# Patient Record
Sex: Male | Born: 2000 | Race: Black or African American | Hispanic: No | Marital: Single | Smoking: Never smoker
Health system: Southern US, Community
[De-identification: ages and names within clinical notes are randomized; demographics above are authoritative.]

## PROBLEM LIST (undated history)

## (undated) DIAGNOSIS — R45851 Suicidal ideations: Secondary | ICD-10-CM

---
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
## 2019-08-13 IMAGING — DX DG HAND COMPLETE 3+V*L*
3 series · 3 of 3 positions shown · non-contrast
Comparison: None.

CLINICAL DATA: Fourth and fifth metacarpal pain post fall.

EXAM:
LEFT HAND - COMPLETE 3+ VIEW

[hand pa]
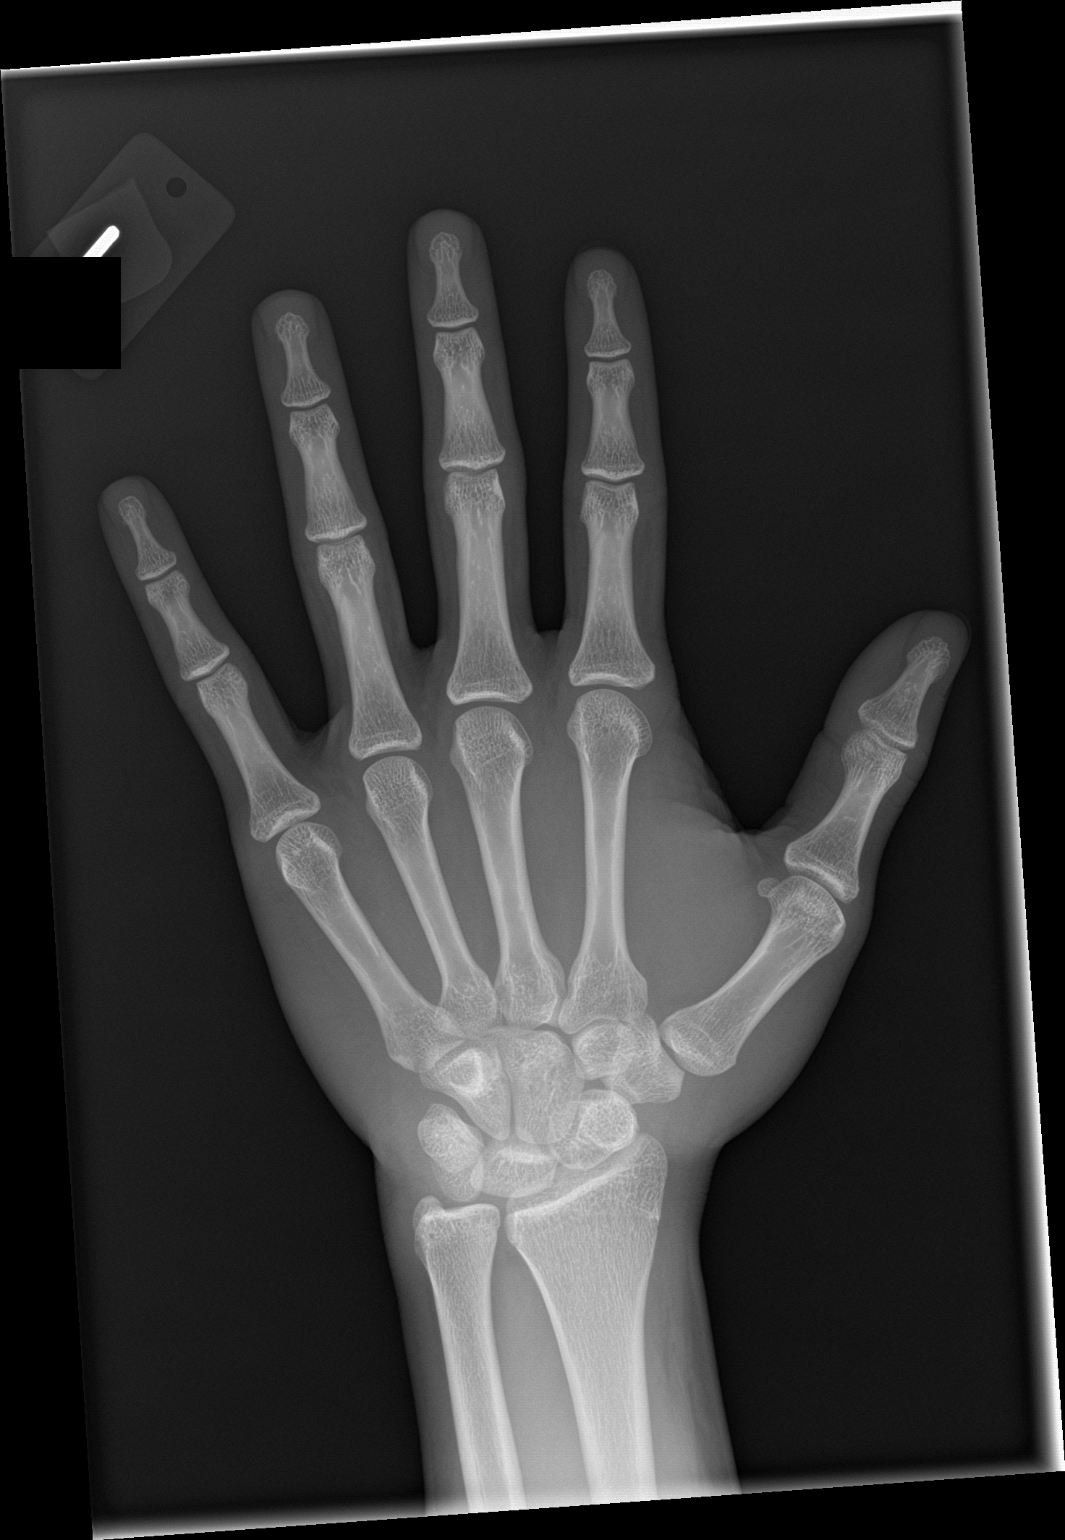

[hand obl]
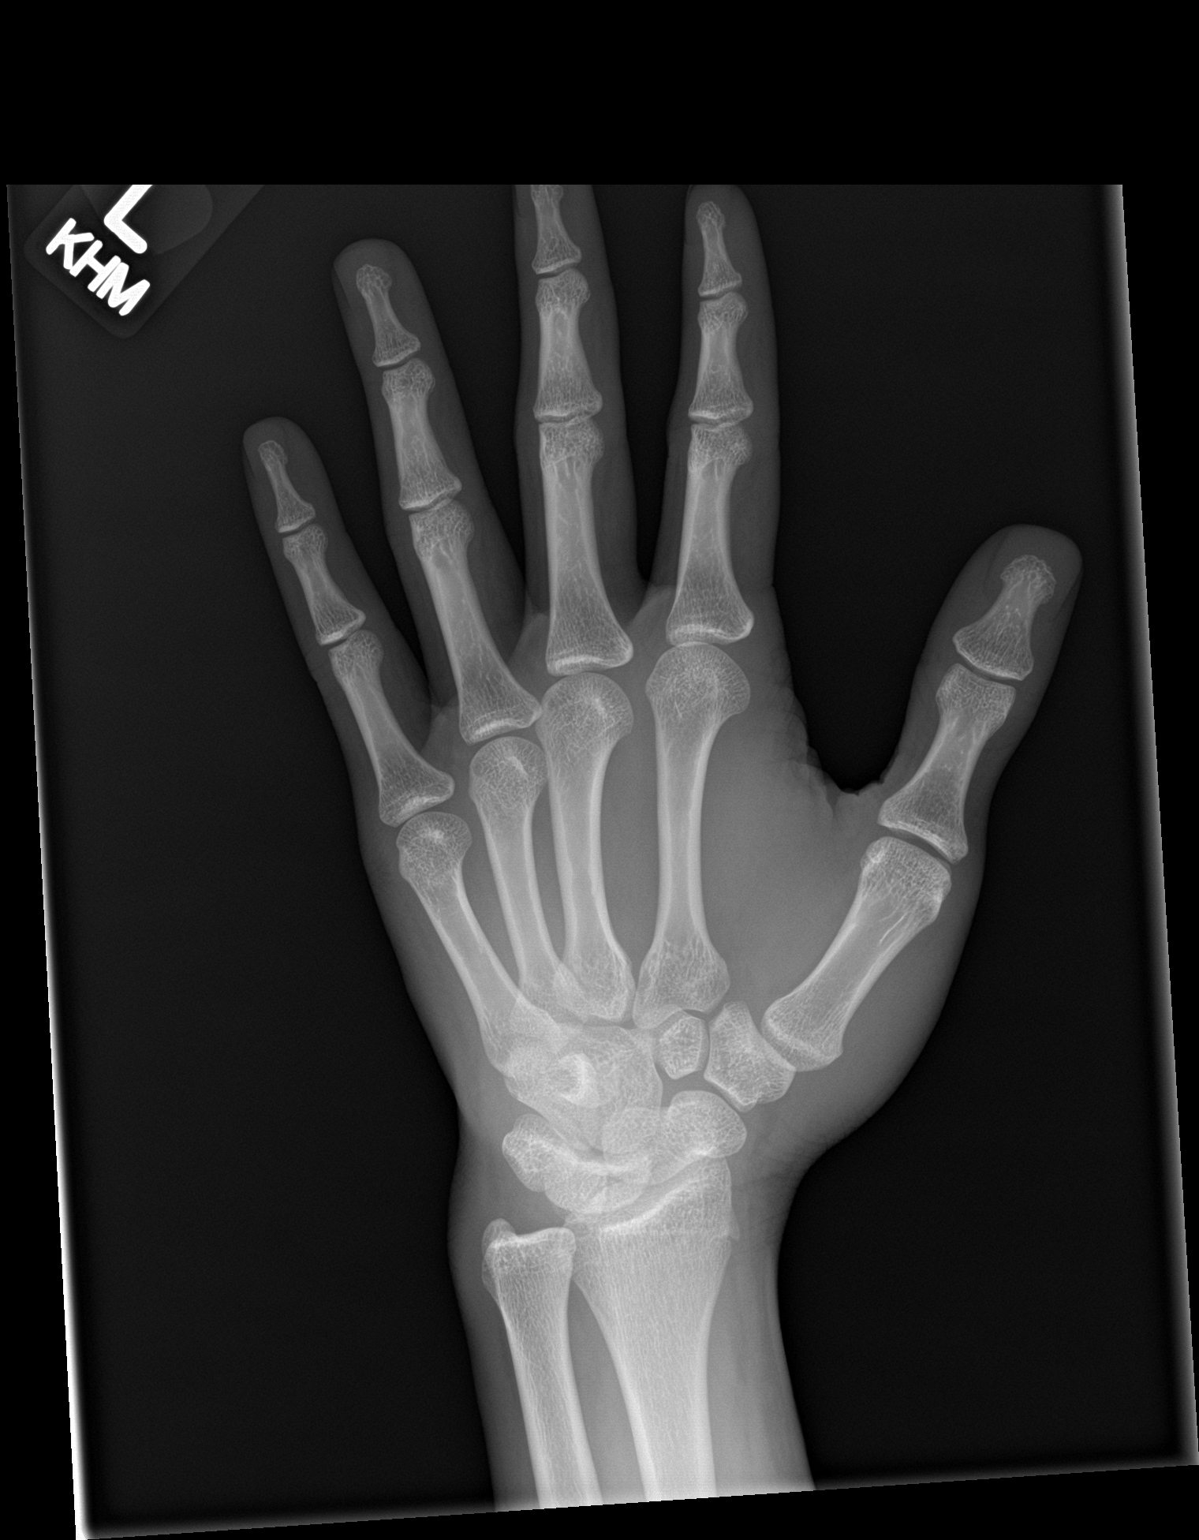

[hand lat]
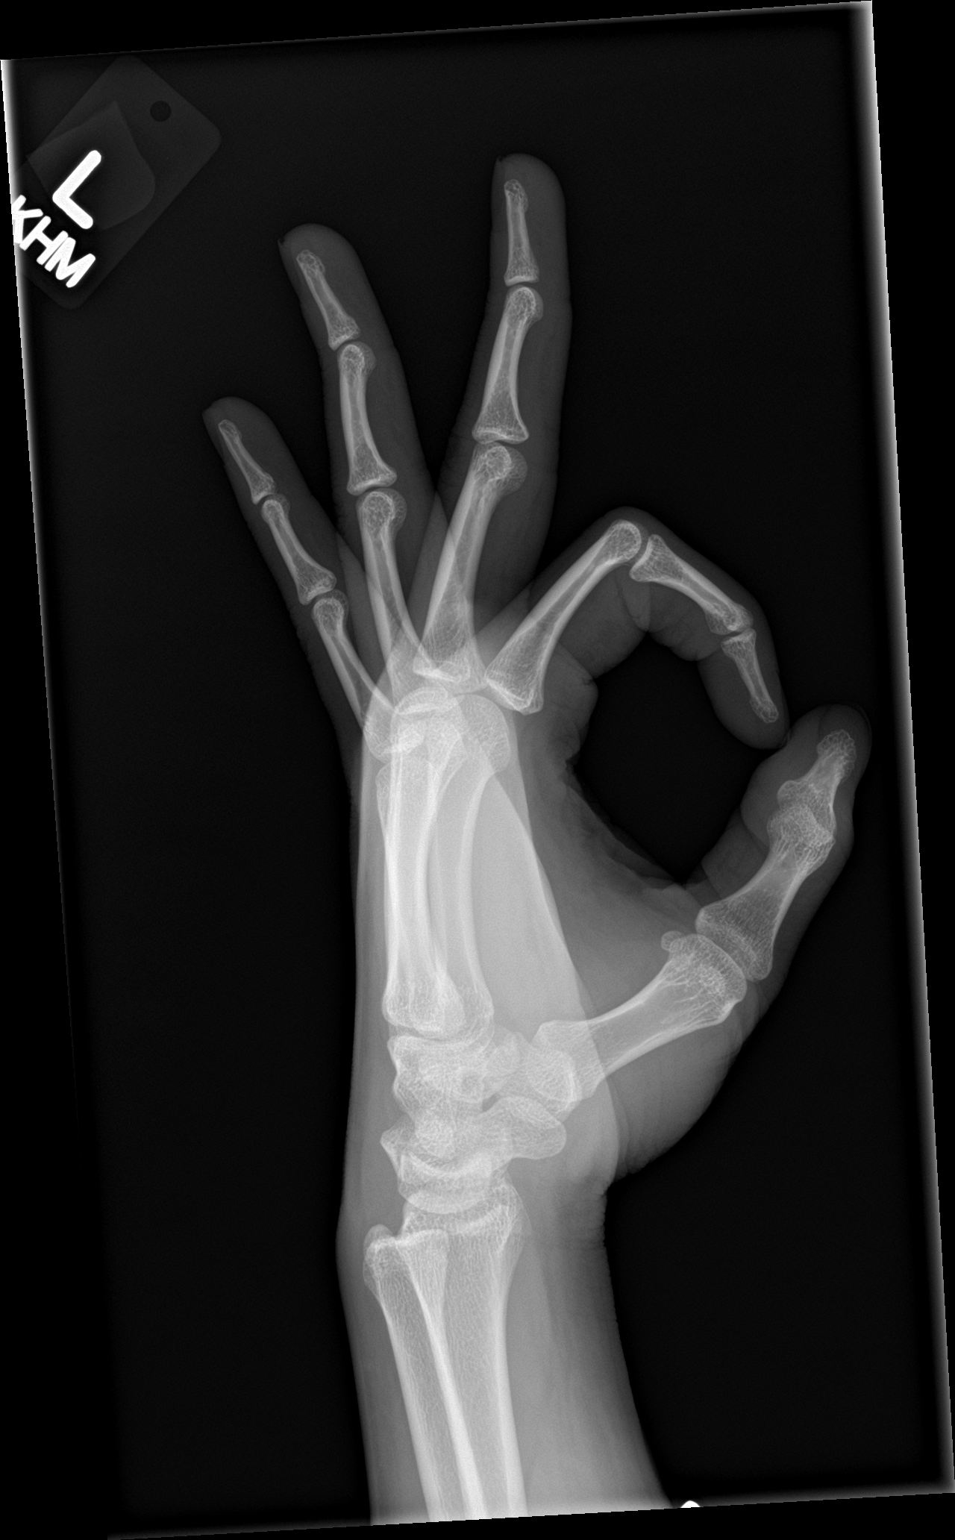

[3 of 3 positions shown; findings below may reference images not displayed]

FINDINGS: There is no evidence of fracture or dislocation. There is no
evidence of arthropathy or other focal bone abnormality. Soft
tissues are unremarkable.
IMPRESSION: Negative.

## 2020-03-03 DIAGNOSIS — Z20822 Contact with and (suspected) exposure to covid-19: Secondary | ICD-10-CM | POA: Diagnosis not present

## 2021-01-29 ENCOUNTER — Other Ambulatory Visit: Payer: Self-pay

## 2021-01-29 ENCOUNTER — Encounter (HOSPITAL_COMMUNITY): Payer: Self-pay

## 2021-01-29 ENCOUNTER — Emergency Department (HOSPITAL_COMMUNITY)
Admission: EM | Admit: 2021-01-29 | Discharge: 2021-01-29 | Disposition: A | Discharge status: Home / Self Care | Payer: Medicaid Other | Attending: Emergency Medicine | Admitting: Emergency Medicine

## 2021-01-29 DIAGNOSIS — J029 Acute pharyngitis, unspecified: Secondary | ICD-10-CM

## 2021-01-29 DIAGNOSIS — B9789 Other viral agents as the cause of diseases classified elsewhere: Secondary | ICD-10-CM | POA: Diagnosis not present

## 2021-01-29 DIAGNOSIS — Z20822 Contact with and (suspected) exposure to covid-19: Secondary | ICD-10-CM | POA: Diagnosis not present

## 2021-01-29 DIAGNOSIS — J069 Acute upper respiratory infection, unspecified: Secondary | ICD-10-CM | POA: Insufficient documentation

## 2021-01-29 LAB — GROUP A STREP BY PCR: Group A Strep by PCR: NOT DETECTED

## 2021-01-29 LAB — RESP PANEL BY RT-PCR (FLU A&B, COVID) ARPGX2
Influenza A by PCR: NEGATIVE
Influenza B by PCR: NEGATIVE
SARS Coronavirus 2 by RT PCR: NEGATIVE

## 2021-01-29 MED ORDER — FLUTICASONE PROPIONATE 50 MCG/ACT NA SUSP: 1.0000 | Freq: Every day | NASAL | 2 refills | Status: AC

## 2021-01-29 MED ORDER — IBUPROFEN 800 MG PO TABS: 800.0000 mg | ORAL_TABLET | Freq: Three times a day (TID) | ORAL | 0 refills | Status: AC

## 2021-01-29 MED ORDER — LIDOCAINE VISCOUS HCL 2 % MT SOLN
15.0000 mL | Freq: Once | OROMUCOSAL | Status: AC
Start: 2021-01-29 — End: 2021-01-29
  Administered 2021-01-29: 15 mL via OROMUCOSAL
  Filled 2021-01-29: qty 15

## 2021-01-29 MED ORDER — IBUPROFEN 800 MG PO TABS
800.0000 mg | ORAL_TABLET | Freq: Once | ORAL | Status: AC
  Administered 2021-01-29: 800 mg via ORAL
  Filled 2021-01-29: qty 1

## 2021-01-29 NOTE — ED Triage Notes (Signed)
Pt presents to ED with complaints of sore throat, headache, congestion, fever x 3-4 days. Pt states he took theraflu with a little relief.

## 2021-01-29 NOTE — ED Provider Notes (Signed)
[facility] EMERGENCY DEPARTMENT Provider Note   CSN: [id number] Arrival date & time: 01/29/21  0846     History Chief Complaint  Patient presents with   Sore Throat    Michael Mccarty is a 20 y.o. male.  Pt presents to the ED today with a sore throat, headache, and cough.  He has been sick for 3-4 days.  He has tried theraflu without improvement in sx.  He took a home Covid test which was negative 3 days ago.  Pt has not been vaccinated for Covid or for the flu.      Past Medical History:  Diagnosis Date   Suicidal intent     There are no problems to display for this patient.   History reviewed. No pertinent surgical history.     No family history on file.  Social History   Tobacco Use   Smoking status: Never   Smokeless tobacco: Never  Vaping Use   Vaping Use: Never used  Substance Use Topics   Alcohol use: No   Drug use: No    Home Medications Prior to Admission medications   Medication Sig Start Date End Date Taking? Authorizing Provider  fluticasone (FLONASE) 50 MCG/ACT nasal spray Place 1 spray into both nostrils daily. 01/29/21  Yes Jacalyn Lefevre, MD  ibuprofen (ADVIL) 800 MG tablet Take 1 tablet (800 mg total) by mouth 3 (three) times daily. 01/29/21  Yes Jacalyn Lefevre, MD    Allergies    Patient has no known allergies.  Review of Systems   Review of Systems  Constitutional:  Positive for fever.  HENT:  Positive for sore throat.   Respiratory:  Positive for cough.   All other systems reviewed and are negative.  Physical Exam Updated Vital Signs BP 129/87 (BP Location: Right Arm)   Pulse 72   Temp 98.1 F (36.7 C) (Oral)   Resp 18   Ht 5\' 6"  (1.676 m)   Wt 81.6 kg   SpO2 95%   BMI 29.05 kg/m   Physical Exam Vitals and nursing note reviewed.  Constitutional:      Appearance: He is well-developed.  HENT:     Head: Normocephalic and atraumatic.     Nose: Congestion present.     Mouth/Throat:     Pharynx: Oropharynx is  clear.  Eyes:     Conjunctiva/sclera: Conjunctivae normal.     Pupils: Pupils are equal, round, and reactive to light.  Cardiovascular:     Rate and Rhythm: Normal rate and regular rhythm.  Pulmonary:     Effort: Pulmonary effort is normal.     Breath sounds: Normal breath sounds.  Abdominal:     General: Bowel sounds are normal.     Palpations: Abdomen is soft.  Musculoskeletal:     Cervical back: Normal range of motion and neck supple.  Skin:    General: Skin is warm.     Capillary Refill: Capillary refill takes less than 2 seconds.  Neurological:     General: No focal deficit present.     Mental Status: He is alert and oriented to person, place, and time.  Psychiatric:        Mood and Affect: Mood normal.        Behavior: Behavior normal.    ED Results / Procedures / Treatments   Labs (all labs ordered are listed, but only abnormal results are displayed) Labs Reviewed  RESP PANEL BY RT-PCR (FLU A&B, COVID) ARPGX2  GROUP A STREP BY  PCR    EKG None  Radiology No results found.  Procedures Procedures   Medications Ordered in ED Medications  ibuprofen (ADVIL) tablet 800 mg (800 mg Oral Given 01/29/21 1022)  lidocaine (XYLOCAINE) 2 % viscous mouth solution 15 mL (15 mLs Mouth/Throat Given 01/29/21 1022)    ED Course  I have reviewed the triage vital signs and the nursing notes.  Pertinent labs & imaging results that were available during my care of the patient were reviewed by me and considered in my medical decision making (see chart for details).    MDM Rules/Calculators/A&P                           Covid/flu/strep neg.  Pt looks nontoxic.  He is stable for d/c.  Return if worse. Final Clinical Impression(s) / ED Diagnoses Final diagnoses:  Viral pharyngitis  Viral upper respiratory tract infection    Rx / DC Orders ED Discharge Orders          Ordered    ibuprofen (ADVIL) 800 MG tablet  3 times daily        01/29/21 1132    fluticasone  (FLONASE) 50 MCG/ACT nasal spray  Daily        01/29/21 1133             Jacalyn Lefevre, MD 01/29/21 1134

## 2021-02-01 ENCOUNTER — Emergency Department (HOSPITAL_COMMUNITY)
Admission: EM | Admit: 2021-02-01 | Discharge: 2021-02-01 | Disposition: A | Discharge status: Home / Self Care | Payer: Medicaid Other | Attending: Emergency Medicine | Admitting: Emergency Medicine

## 2021-02-01 ENCOUNTER — Telehealth: Payer: Self-pay

## 2021-02-01 ENCOUNTER — Other Ambulatory Visit: Payer: Self-pay

## 2021-02-01 DIAGNOSIS — H5711 Ocular pain, right eye: Secondary | ICD-10-CM | POA: Diagnosis present

## 2021-02-01 DIAGNOSIS — H1031 Unspecified acute conjunctivitis, right eye: Secondary | ICD-10-CM | POA: Insufficient documentation

## 2021-02-01 DIAGNOSIS — B309 Viral conjunctivitis, unspecified: Secondary | ICD-10-CM

## 2021-02-01 DIAGNOSIS — H5710 Ocular pain, unspecified eye: Secondary | ICD-10-CM | POA: Diagnosis not present

## 2021-02-01 NOTE — ED Triage Notes (Signed)
Pt states eye is swollen up, red, watery, itching. Pt believes it is pink eye. Began two days ago and has gotten worse.

## 2021-02-01 NOTE — ED Provider Notes (Signed)
[facility] Provider Note   CSN: [id number] Arrival date & time: 02/01/21  1041     History Chief Complaint  Patient presents with   Eye Pain    Michael Mccarty is a 20 y.o. male presents emergency department for evaluation of his right eye swelling with discharge and conjunctival redness for the past 2 days.  He endorses some pain with extraocular movements.  No purulent drainage.  Denies any fevers.  Patient reports he had URI symptoms a few weeks ago, but none now.  He denies any photophobia, diplopia, or other visual disturbances.  Denies the episodes previous to this.  No clear surgical history.  Denies any medications.  No known drug allergies.  Denies any smoking, EtOH, or drug use.   Eye Pain      Past Medical History:  Diagnosis Date   Suicidal intent     There are no problems to display for this patient.   No past surgical history on file.     No family history on file.  Social History   Tobacco Use   Smoking status: Never   Smokeless tobacco: Never  Vaping Use   Vaping Use: Never used  Substance Use Topics   Alcohol use: No   Drug use: No    Home Medications Prior to Admission medications   Medication Sig Start Date End Date Taking? Authorizing Provider  fluticasone (FLONASE) 50 MCG/ACT nasal spray Place 1 spray into both nostrils daily. 01/29/21   Isla Pence, MD  ibuprofen (ADVIL) 800 MG tablet Take 1 tablet (800 mg total) by mouth 3 (three) times daily. 01/29/21   Isla Pence, MD    Allergies    Patient has no known allergies.  Review of Systems   Review of Systems  Constitutional:  Negative for fever.  HENT:  Negative for congestion and rhinorrhea.   Eyes:  Positive for discharge, redness and itching. Negative for photophobia, pain and visual disturbance.  Respiratory:  Negative for cough.    Physical Exam Updated Vital Signs BP 120/83 (BP Location: Right Arm)   Pulse 67   Temp 98 F (36.7 C) (Oral)    Resp 18   Ht 5\' 6"  (1.676 m)   Wt 81.6 kg   SpO2 99%   BMI 29.05 kg/m   Physical Exam Vitals and nursing note reviewed.  Constitutional:      General: He is not in acute distress.    Appearance: Normal appearance. He is normal weight. He is not toxic-appearing.  HENT:     Head: Normocephalic and atraumatic.     Right Ear: Tympanic membrane, ear canal and external ear normal.     Left Ear: Tympanic membrane, ear canal and external ear normal.     Nose: Nose normal.     Mouth/Throat:     Mouth: Mucous membranes are moist.     Pharynx: No oropharyngeal exudate or posterior oropharyngeal erythema.  Eyes:     General: No scleral icterus.       Right eye: Discharge present.        Left eye: No discharge.     Extraocular Movements: Extraocular movements intact.     Conjunctiva/sclera:     Right eye: Right conjunctiva is injected.     Pupils: Pupils are equal, round, and reactive to light.     Comments: Moderately swollen upper and lower right eyelid.  Conjunctiva erythematous.  Clear drainage noted from the right eye.  PERRLA.  EOMI.  Patient  has mild pain when leaning laterally.  Nontender to palpation.  Patient has nontender frontal or maxillary sinus.  Pulmonary:     Effort: Pulmonary effort is normal. No respiratory distress.  Skin:    General: Skin is dry.     Findings: No rash.  Neurological:     General: No focal deficit present.     Mental Status: He is alert. Mental status is at baseline.  Psychiatric:        Mood and Affect: Mood normal.    ED Results / Procedures / Treatments   Labs (all labs ordered are listed, but only abnormal results are displayed) Labs Reviewed - No data to display  EKG None  Radiology No results found.  Procedures Procedures   Medications Ordered in ED Medications - No data to display  ED Course  I have reviewed the triage vital signs and the nursing notes.  Pertinent labs & imaging results that were available during my care of  the patient were reviewed by me and considered in my medical decision making (see chart for details).  20 year old male presents emergency department for evaluation of right eye swelling and discharge.  Low suspicion for periorbital cellulitis as the patient does not endorse any photophobia, visual disturbance, fevers, severe pain with eye movement, or tenderness to the area.  Physical exam correlates more with viral conjunctivitis.  Discussed good eye hygiene with the patient.  Recommended lubricating eyedrops.  Strict return precautions discussed.  Patient agrees to plan.  Patient is stable being discharged home in good condition.    MDM Rules/Calculators/A&P                          Final Clinical Impression(s) / ED Diagnoses Final diagnoses:  Acute viral conjunctivitis of right eye    Rx / DC Orders ED Discharge Orders     None        Achille Rich, PA-C 02/01/21 1417    Derwood Kaplan, MD 02/02/21 [phone]

## 2021-02-01 NOTE — Telephone Encounter (Signed)
Transition Care Management Follow-up Telephone Call Date of discharge and from where: 01/29/2021 from [facility] How have you been since you were released from the hospital? Pt stated that he is feeling better and did not have any questions or concerns.  Any questions or concerns? No  Items Reviewed: Did the pt receive and understand the discharge instructions provided? Yes  Medications obtained and verified? Yes  Other? No  Any new allergies since your discharge? No  Dietary orders reviewed? No Do you have support at home? Yes   Functional Questionnaire: (I = Independent and D = Dependent) ADLs: I  Bathing/Dressing- I  Meal Prep- I  Eating- I  Maintaining continence- I  Transferring/Ambulation- I  Managing Meds- I   Follow up appointments reviewed:  PCP Hospital f/u appt confirmed? No   Specialist Hospital f/u appt confirmed? No   Are transportation arrangements needed? No  If their condition worsens, is the pt aware to call PCP or go to the Emergency Dept.? Yes Was the patient provided with contact information for the PCP's office or ED? Yes Was to pt encouraged to call back with questions or concerns? Yes

## 2021-02-01 NOTE — Discharge Instructions (Addendum)
You were seen here today for evaluation of your right eye.  This is likely conjunctivitis.  You can continue to apply warm compresses to the area. Apply artifical tears which can be picked up OTC.  You can take Tylenol or ibuprofen as needed for pain.  If you have any worsening symptoms such as photophobia, visual changes, worsening swelling or pain, please return to the nearest emergency department for reevaluation.

## 2021-02-02 ENCOUNTER — Telehealth: Payer: Self-pay

## 2021-02-02 NOTE — Telephone Encounter (Signed)
Transition Care Management Follow-up Telephone Call Date of discharge and from where: 02/01/2021 from [facility] How have you been since you were released from the hospital? Pt stated that he is feeling better and did not have any questions or concerns at this time.  Any questions or concerns? No  Items Reviewed: Did the pt receive and understand the discharge instructions provided? Yes  Medications obtained and verified? Yes  Other? No  Any new allergies since your discharge? No  Dietary orders reviewed? No Do you have support at home? Yes   Functional Questionnaire: (I = Independent and D = Dependent) ADLs: I  Bathing/Dressing- I  Meal Prep- I  Eating- I  Maintaining continence- I  Transferring/Ambulation- I  Managing Meds- I   Follow up appointments reviewed:  PCP Hospital f/u appt confirmed? No   Specialist Hospital f/u appt confirmed? No   Are transportation arrangements needed? No  If their condition worsens, is the pt aware to call PCP or go to the Emergency Dept.? Yes Was the patient provided with contact information for the PCP's office or ED? Yes Was to pt encouraged to call back with questions or concerns? Yes

## 2022-03-14 ENCOUNTER — Ambulatory Visit
Admission: EM | Admit: 2022-03-14 | Discharge: 2022-03-14 | Disposition: A | Discharge status: Home / Self Care | Payer: Medicaid Other | Attending: Physician Assistant | Admitting: Physician Assistant

## 2022-03-14 DIAGNOSIS — R051 Acute cough: Secondary | ICD-10-CM | POA: Insufficient documentation

## 2022-03-14 DIAGNOSIS — Z1152 Encounter for screening for COVID-19: Secondary | ICD-10-CM | POA: Insufficient documentation

## 2022-03-14 DIAGNOSIS — J069 Acute upper respiratory infection, unspecified: Secondary | ICD-10-CM | POA: Insufficient documentation

## 2022-03-14 MED ORDER — PROMETHAZINE-DM 6.25-15 MG/5ML PO SYRP: 5.0000 mL | ORAL_SOLUTION | Freq: Four times a day (QID) | ORAL | 0 refills | Status: AC | PRN

## 2022-03-14 NOTE — ED Provider Notes (Signed)
[name]    CSN: [id number] Arrival date & time: 03/14/22  9562      History   Chief Complaint Chief Complaint  Patient presents with   Cough   Generalized Body Aches   Headache    HPI Michael Mccarty is a 22 y.o. male.   22 year old male presents with cough and congestion.  Patient indicates for the past 3 to 4 days he has been having increasing upper respiratory congestion with sinus congestion frontal and maxillary, intermittent headaches.  Patient also indicates he has been having rhinitis and postnasal drip which is clear.  He also indicates he been having chest congestion with intermittent cough with production being clear.  Patient denies any wheezing or shortness of breath.  He does indicate that he has had some chills over the past couple days but no fever.  Patient indicates that he believes that his mother had flu and his bowels had similar type symptoms however he has not had contact with either over the past week.  Patient indicates he has been taking some Tylenol without relief of the symptoms.  Patient has not taken any cold preparations to help control the cough and congestion.  Patient denies any nausea or vomiting.   Cough Associated symptoms: headaches and rhinorrhea   Headache Associated symptoms: cough, drainage and sinus pressure     Past Medical History:  Diagnosis Date   Suicidal intent     There are no problems to display for this patient.   History reviewed. No pertinent surgical history.     Home Medications    Prior to Admission medications   Medication Sig Start Date End Date Taking? Authorizing Provider  promethazine-dextromethorphan (PROMETHAZINE-DM) 6.25-15 MG/5ML syrup Take 5 mLs by mouth 4 (four) times daily as needed for cough. 03/14/22  Yes Nyoka Lint, PA-C  fluticasone [facility]) 50 MCG/ACT nasal spray Place 1 spray into both nostrils daily. 01/29/21   Isla Pence, MD  ibuprofen (ADVIL) 800 MG tablet Take 1  tablet (800 mg total) by mouth 3 (three) times daily. 01/29/21   Isla Pence, MD    Family History Family History  Family history unknown: Yes    Social History Social History   Tobacco Use   Smoking status: Never   Smokeless tobacco: Never  Vaping Use   Vaping Use: Never used  Substance Use Topics   Alcohol use: No   Drug use: No     Allergies   Patient has no known allergies.   Review of Systems Review of Systems  HENT:  Positive for postnasal drip, rhinorrhea and sinus pressure.   Respiratory:  Positive for cough.   Neurological:  Positive for headaches.     Physical Exam Triage Vital Signs ED Triage Vitals  Enc Vitals Group     BP 03/14/22 0954 123/84     Pulse Rate 03/14/22 0954 74     Resp 03/14/22 0954 18     Temp 03/14/22 0954 98.3 F (36.8 C)     Temp Source 03/14/22 0954 Oral     SpO2 03/14/22 0954 97 %     Weight --      Height --      Head Circumference --      Peak Flow --      Pain Score 03/14/22 0956 5     Pain Loc --      Pain Edu? --      Excl. in [place]? --    No data found.  Updated Vital Signs BP 123/84 (BP Location: Left Arm)   Pulse 74   Temp 98.3 F (36.8 C) (Oral)   Resp 18   SpO2 97%   Visual Acuity Right Eye Distance:   Left Eye Distance:   Bilateral Distance:    Right Eye Near:   Left Eye Near:    Bilateral Near:     Physical Exam Constitutional:      Appearance: He is well-developed.  HENT:     Right Ear: Tympanic membrane and ear canal normal.     Left Ear: Tympanic membrane and ear canal normal.     Mouth/Throat:     Mouth: Mucous membranes are moist.     Pharynx: Oropharynx is clear.  Cardiovascular:     Rate and Rhythm: Normal rate and regular rhythm.     Heart sounds: Normal heart sounds.  Pulmonary:     Effort: Pulmonary effort is normal.     Breath sounds: Normal breath sounds and air entry. No wheezing, rhonchi or rales.  Lymphadenopathy:     Cervical: No cervical adenopathy.  Neurological:      Mental Status: He is alert.      UC Treatments / Results  Labs (all labs ordered are listed, but only abnormal results are displayed) Labs Reviewed  SARS CORONAVIRUS 2 (TAT 6-24 HRS)    EKG   Radiology No results found.  Procedures Procedures (including critical care time)  Medications Ordered in UC Medications - No data to display  Initial Impression / Assessment and Plan / UC Course  I have reviewed the triage vital signs and the nursing notes.  Pertinent labs & imaging results that were available during my care of the patient were reviewed by me and considered in my medical decision making (see chart for details).    Plan: 1.  The acute upper respiratory tract infection will be treated with the following: A.  Phenergan DM, 1 to 2 teaspoons every 6-8 hours needed for cough and congestion. 2.  The acute cough will be treated with the following: A.  Phenergan DM, 1 to 2 teaspoons every 6-8 hours needed for cough and congestion. 3.  Screening for COVID-19 will be treated with the following: A.  Treatment may be considered depending on the results of COVID-19 test. 4.  Advised follow-up PCP return to urgent care as needed. Final Clinical Impressions(s) / UC Diagnoses   Final diagnoses:  Acute upper respiratory infection  Acute cough  Encounter for screening for COVID-19     Discharge Instructions      COVID test will be completed in 48 hours.  If you do not get a call from this office that indicates the test is negative.  Log onto MyChart to view the test results when it post in 48 hours.  Advised to take Phenergan DM, 1 to 2 teaspoons every 6-8 hours needed for cough and congestion.  (Be cautious with this medicine as it does cause drowsiness and sedation)  Advised to use ibuprofen or Motrin for body aches and pain or fever. Advised follow-up PCP or return to urgent care as needed.   ED Prescriptions     Medication Sig Dispense Auth. Provider    promethazine-dextromethorphan (PROMETHAZINE-DM) 6.25-15 MG/5ML syrup Take 5 mLs by mouth 4 (four) times daily as needed for cough. 118 mL Nyoka Lint, PA-C      PDMP not reviewed this encounter.   Nyoka Lint, PA-C 03/14/22 1011

## 2022-03-14 NOTE — ED Triage Notes (Signed)
Pt presents with non productive cough, congestion, generalized body aches, and headache X 3 days.

## 2022-03-14 NOTE — Discharge Instructions (Signed)
COVID test will be completed in 48 hours.  If you do not get a call from this office that indicates the test is negative.  Log onto MyChart to view the test results when it post in 48 hours.  Advised to take Phenergan DM, 1 to 2 teaspoons every 6-8 hours needed for cough and congestion.  (Be cautious with this medicine as it does cause drowsiness and sedation)  Advised to use ibuprofen or Motrin for body aches and pain or fever. Advised follow-up PCP or return to urgent care as needed.

## 2022-03-16 LAB — SARS CORONAVIRUS 2 (TAT 6-24 HRS): SARS Coronavirus 2: POSITIVE — AB
